# Patient Record
Sex: Female | Born: 1955 | Race: Black or African American | Hispanic: No | Marital: Married | Smoking: Never smoker
Health system: Southern US, Community
[De-identification: ages and names within clinical notes are randomized; demographics above are authoritative.]

## PROBLEM LIST (undated history)

## (undated) DIAGNOSIS — I10 Essential (primary) hypertension: Secondary | ICD-10-CM

## (undated) DIAGNOSIS — Z8673 Personal history of transient ischemic attack (TIA), and cerebral infarction without residual deficits: Secondary | ICD-10-CM

## (undated) DIAGNOSIS — Z5320 Procedure and treatment not carried out because of patient's decision for unspecified reasons: Secondary | ICD-10-CM

## (undated) DIAGNOSIS — E785 Hyperlipidemia, unspecified: Secondary | ICD-10-CM

## (undated) DIAGNOSIS — I340 Nonrheumatic mitral (valve) insufficiency: Secondary | ICD-10-CM

## (undated) DIAGNOSIS — M791 Myalgia, unspecified site: Secondary | ICD-10-CM

## (undated) DIAGNOSIS — I2510 Atherosclerotic heart disease of native coronary artery without angina pectoris: Secondary | ICD-10-CM

## (undated) HISTORY — DX: Atherosclerotic heart disease of native coronary artery without angina pectoris: I25.10

## (undated) HISTORY — DX: Essential (primary) hypertension: I10

## (undated) HISTORY — DX: Nonrheumatic mitral (valve) insufficiency: I34.0

## (undated) HISTORY — DX: Personal history of transient ischemic attack (TIA), and cerebral infarction without residual deficits: Z86.73

## (undated) HISTORY — DX: Myalgia, unspecified site: M79.10

## (undated) HISTORY — DX: Hyperlipidemia, unspecified: E78.5

## (undated) HISTORY — PX: OTHER SURGICAL HISTORY: SHX169

## (undated) HISTORY — DX: Procedure and treatment not carried out because of patient's decision for unspecified reasons: Z53.20

---

## 2021-01-01 ENCOUNTER — Emergency Department (HOSPITAL_COMMUNITY)

## 2021-01-01 ENCOUNTER — Other Ambulatory Visit: Payer: Self-pay

## 2021-01-01 ENCOUNTER — Emergency Department (HOSPITAL_COMMUNITY)
Admission: EM | Admit: 2021-01-01 | Discharge: 2021-01-01 | Disposition: A | Discharge status: Home / Self Care | Attending: Emergency Medicine | Admitting: Emergency Medicine

## 2021-01-01 DIAGNOSIS — H53459 Other localized visual field defect, unspecified eye: Secondary | ICD-10-CM | POA: Insufficient documentation

## 2021-01-01 DIAGNOSIS — I10 Essential (primary) hypertension: Secondary | ICD-10-CM | POA: Diagnosis not present

## 2021-01-01 DIAGNOSIS — M79622 Pain in left upper arm: Secondary | ICD-10-CM | POA: Diagnosis not present

## 2021-01-01 DIAGNOSIS — Z8673 Personal history of transient ischemic attack (TIA), and cerebral infarction without residual deficits: Secondary | ICD-10-CM | POA: Diagnosis not present

## 2021-01-01 DIAGNOSIS — R202 Paresthesia of skin: Secondary | ICD-10-CM | POA: Diagnosis present

## 2021-01-01 LAB — COMPREHENSIVE METABOLIC PANEL
ALT: 28 U/L (ref 0–44)
AST: 24 U/L (ref 15–41)
Albumin: 3.8 g/dL (ref 3.5–5.0)
Alkaline Phosphatase: 68 U/L (ref 38–126)
Anion gap: 6 (ref 5–15)
BUN: 16 mg/dL (ref 8–23)
CO2: 31 mmol/L (ref 22–32)
Calcium: 9.5 mg/dL (ref 8.9–10.3)
Chloride: 103 mmol/L (ref 98–111)
Creatinine, Ser: 0.77 mg/dL (ref 0.44–1.00)
GFR, Estimated: >60 mL/min (ref >60)
Glucose, Bld: 114 mg/dL — ABNORMAL HIGH (ref 70–99)
Potassium: 3.7 mmol/L (ref 3.5–5.1)
Sodium: 140 mmol/L (ref 135–145)
Total Bilirubin: 0.3 mg/dL (ref 0.3–1.2)
Total Protein: 7.5 g/dL (ref 6.5–8.1)

## 2021-01-01 LAB — I-STAT CHEM 8, ED
BUN: 19 mg/dL (ref 8–23)
Calcium, Ion: 1.24 mmol/L (ref 1.15–1.40)
Chloride: 103 mmol/L (ref 98–111)
Creatinine, Ser: 0.8 mg/dL (ref 0.44–1.00)
Glucose, Bld: 107 mg/dL — ABNORMAL HIGH (ref 70–99)
HCT: 40 % (ref 36.0–46.0)
Hemoglobin: 13.6 g/dL (ref 12.0–15.0)
Potassium: 3.7 mmol/L (ref 3.5–5.1)
Sodium: 142 mmol/L (ref 135–145)
TCO2: 29 mmol/L (ref 22–32)

## 2021-01-01 LAB — CBC
HCT: 39.4 % (ref 36.0–46.0)
Hemoglobin: 12.9 g/dL (ref 12.0–15.0)
MCH: 31.7 pg (ref 26.0–34.0)
MCHC: 32.7 g/dL (ref 30.0–36.0)
MCV: 96.8 fL (ref 80.0–100.0)
Platelets: 410 10*3/uL — ABNORMAL HIGH (ref 150–400)
RBC: 4.07 MIL/uL (ref 3.87–5.11)
RDW: 13 % (ref 11.5–15.5)
WBC: 4.5 10*3/uL (ref 4.0–10.5)
nRBC: 0 % (ref 0.0–0.2)

## 2021-01-01 LAB — DIFFERENTIAL
Abs Immature Granulocytes: 0.01 10*3/uL (ref 0.00–0.07)
Basophils Absolute: 0 10*3/uL (ref 0.0–0.1)
Basophils Relative: 1 %
Eosinophils Absolute: 0.1 10*3/uL (ref 0.0–0.5)
Eosinophils Relative: 2 %
Immature Granulocytes: 0 %
Lymphocytes Relative: 41 %
Lymphs Abs: 1.8 10*3/uL (ref 0.7–4.0)
Monocytes Absolute: 0.5 10*3/uL (ref 0.1–1.0)
Monocytes Relative: 12 %
Neutro Abs: 2 10*3/uL (ref 1.7–7.7)
Neutrophils Relative %: 44 %

## 2021-01-01 LAB — PROTIME-INR
INR: 1.1 (ref 0.8–1.2)
Prothrombin Time: 13.5 seconds (ref 11.4–15.2)

## 2021-01-01 LAB — APTT: aPTT: 26 seconds (ref 24–36)

## 2021-01-01 MED ORDER — SODIUM CHLORIDE 0.9% FLUSH
3.0000 mL | Freq: Once | INTRAVENOUS | Status: AC
  Administered 2021-01-01: 10 mL via INTRAVENOUS

## 2021-01-01 NOTE — ED Provider Notes (Signed)
[facility] Provider Note   CSN: [id number] Arrival date & time: 01/01/21  1600     History Chief Complaint  Patient presents with  . Numbness    Allison Galvan is a 65 y.o. female.  Patient is a 65 year old female with past medical history significant for TIA and MI in 2009 and hypertension.  She presents today with left upper arm tightness, tingling in her left hand, left facial tingling that started around 1500.  She reports that she took a baby aspirin and came to the hospital.  Her symptoms lasted for about 30 minutes.  Currently, she only is experiencing tightness in her left upper arm in a helical pattern and left cheek tingling.  She denies any chest pain, difficulty breathing.  She denies any bilateral upper or lower extremity weakness.  Denies any leg symptoms.  Denies any changes in vision, has a chronic left eye decreased temporal visual field from her prior TIA.  She did not have any difficulty with speech.  States that prior to today, she was in her normal state of health.  She took her medications this morning.    She [place] with physicians in [place], as she used to live there, and frequently goes back. Denies history of DM, thyroid disorder, HLD Not on a statin Only take norvasc 10mg  QD and baby ASA QOD.    No past medical history on file.  There are no problems to display for this patient.    OB History   No obstetric history on file.     No family history on file.     Home Medications Prior to Admission medications   Not on File    Allergies    Patient has no allergy information on record.  Review of Systems   Review of Systems  Constitutional: Negative for activity change, appetite change, chills and fever.  HENT: Negative for congestion.   Eyes: Positive for visual disturbance (chronic).  Respiratory: Negative for cough and shortness of breath.   Cardiovascular: Negative for chest pain.   Gastrointestinal: Negative for abdominal pain, diarrhea, nausea and vomiting.  Genitourinary: Negative for difficulty urinating and dysuria.  Musculoskeletal:       Tightness in left upper extremity  Neurological: Negative for syncope, facial asymmetry, speech difficulty and weakness.       Left sided facial tingling    Physical Exam Updated Vital Signs BP (!) 165/87   Pulse 75   Temp 98.4 F (36.9 C) (Oral)   Resp 19   SpO2 99%   Physical Exam Vitals reviewed.  Constitutional:      General: She is not in acute distress.    Appearance: Normal appearance. She is not ill-appearing or toxic-appearing.  HENT:     Head: Normocephalic and atraumatic.     Nose: Nose normal.     Mouth/Throat:     Mouth: Mucous membranes are moist.  Eyes:     General: Visual field deficit (OS, temporally, chronic) present.     Extraocular Movements: Extraocular movements intact.     Conjunctiva/sclera: Conjunctivae normal.     Pupils: Pupils are equal, round, and reactive to light.     Comments: Left temporal visual field decreased, baseline per patient  Cardiovascular:     Rate and Rhythm: Normal rate and regular rhythm.     Heart sounds: No murmur heard. No friction rub. No gallop.   Pulmonary:     Effort: Pulmonary effort is normal.  Breath sounds: Normal breath sounds. No wheezing, rhonchi or rales.  Musculoskeletal:     Cervical back: Normal range of motion.  Neurological:     General: No focal deficit present.     Mental Status: She is alert and oriented to person, place, and time.     GCS: GCS eye subscore is 4. GCS verbal subscore is 5. GCS motor subscore is 6.     Cranial Nerves: No facial asymmetry.     Sensory: Sensory deficit (decreased sensation V2 distribution on left) present.     Motor: Motor function is intact. No weakness, atrophy or abnormal muscle tone.     Coordination: Coordination is intact.     Deep Tendon Reflexes:     Reflex Scores:      Patellar reflexes are  2+ on the right side and 2+ on the left side.      Achilles reflexes are 2+ on the right side and 2+ on the left side.    ED Results / Procedures / Treatments   Labs (all labs ordered are listed, but only abnormal results are displayed) Labs Reviewed  CBC - Abnormal; Notable for the following components:      Result Value   Platelets 410 (*)    All other components within normal limits  COMPREHENSIVE METABOLIC PANEL - Abnormal; Notable for the following components:   Glucose, Bld 114 (*)    All other components within normal limits  I-STAT CHEM 8, ED - Abnormal; Notable for the following components:   Glucose, Bld 107 (*)    All other components within normal limits  PROTIME-INR  APTT  DIFFERENTIAL  CBG MONITORING, ED    EKG None  Radiology CT HEAD WO CONTRAST  Result Date: 01/01/2021 CLINICAL DATA:  Neuro deficit EXAM: CT HEAD WITHOUT CONTRAST TECHNIQUE: Contiguous axial images were obtained from the base of the skull through the vertex without intravenous contrast. COMPARISON:  None. FINDINGS: Brain: Areas of low-density noted in the right frontal lobe and right occipital lobe compatible with infarcts, age indeterminate. Mild chronic small vessel disease throughout the deep white matter. No hemorrhage or hydrocephalus. Vascular: No hyperdense vessel or unexpected calcification. Skull: No acute calvarial abnormality. Sinuses/Orbits: Visualized paranasal sinuses and mastoids clear. Orbital soft tissues unremarkable. Other: None IMPRESSION: Age-indeterminate infarcts within the right frontal and occipital lobes. Chronic small vessel disease throughout the deep white matter. Electronically Signed   By: Charlett Nose M.D.   On: 01/01/2021 17:54   MR BRAIN WO CONTRAST  Result Date: 01/01/2021 CLINICAL DATA:  Initial evaluation for acute TIA. EXAM: MRI HEAD WITHOUT CONTRAST TECHNIQUE: Multiplanar, multiecho pulse sequences of the brain and surrounding structures were obtained without  intravenous contrast. COMPARISON:  Prior head CT from earlier same day. FINDINGS: Brain: Cerebral volume within normal limits for age. Patchy T2/FLAIR hyperintensity within the periventricular and deep white matter both cerebral hemispheres most consistent with chronic small vessel ischemic disease. Overall, appearance is mild to moderate in nature. Few scattered areas of encephalomalacia and gliosis involving the cortical gray matter of the right frontal, right occipital, and left parietal lobes are seen, consistent with chronic ischemic infarcts. Associated mild chronic hemosiderin staining. No abnormal foci of restricted diffusion to suggest acute or subacute ischemia. Gray-white matter differentiation otherwise maintained. No other areas of remote cortical infarction. No other evidence for acute or chronic intracranial hemorrhage. No mass lesion, midline shift or mass effect. No hydrocephalus or extra-axial fluid collection. Pituitary gland suprasellar region within normal limits. Midline  structures intact. Vascular: Major intracranial vascular flow voids are maintained. Skull and upper cervical spine: Craniocervical junction within normal limits. Bone marrow signal intensity normal. No focal marrow replacing lesion. No scalp soft tissue abnormality. Sinuses/Orbits: Globes and orbital soft tissues within normal limits. Paranasal sinuses are clear. No significant mastoid effusion. Inner ear structures grossly normal. Other: None. IMPRESSION: 1. No acute intracranial abnormality. 2. 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 3. Underlying mild to moderate chronic microvascular ischemic disease. Electronically Signed   By: Rise Mu M.D.   On: 01/01/2021 21:50   MR Cervical Spine Wo Contrast  Result Date: 01/01/2021 CLINICAL DATA:  Initial evaluation for cervical radiculopathy, left upper extremity tightness. EXAM: MRI CERVICAL SPINE  WITHOUT CONTRAST TECHNIQUE: Multiplanar, multisequence MR imaging of the cervical spine was performed. No intravenous contrast was administered. COMPARISON:  None available. FINDINGS: Alignment: Straightening of the normal cervical lordosis. No listhesis. Vertebrae: Vertebral body height maintained without acute or chronic fracture. Bone marrow signal intensity within normal limits. No discrete or worrisome osseous lesions. No abnormal marrow edema. Cord: Normal signal morphology. Posterior Fossa, vertebral arteries, paraspinal tissues: Visualized brain and posterior fossa within normal limits. Craniocervical junction normal. Paraspinous and prevertebral soft tissues within normal limits. Normal intravascular flow voids seen within the vertebral arteries bilaterally. Disc levels: C2-C3: Unremarkable. C3-C4: Minimal annular disc bulge. No spinal stenosis. Foramina remain patent. C4-C5: Mild annular disc bulge with uncovertebral hypertrophy. Mild bilateral facet degeneration. No spinal stenosis. Foramina remain patent. C5-C6: Mild diffuse disc bulge with bilateral uncovertebral hypertrophy. Minimal flattening of the ventral thecal sac without significant spinal stenosis. Foramina remain patent. C6-C7: Mild disc bulge with uncovertebral hypertrophy. No significant spinal stenosis. Foramina remain patent. C7-T1: Minimal disc bulge. Mild facet hypertrophy. No spinal stenosis. Foramina remain widely patent. Visualized upper thoracic spine demonstrates no significant finding. IMPRESSION: 1. Mild noncompressive disc bulging at C3-4 through C7-T1 without significant stenosis or neural impingement. 2. Otherwise unremarkable MRI of the cervical spine. No findings to explain patient's symptoms identified. Electronically Signed   By: Rise Mu M.D.   On: 01/01/2021 21:55    Procedures Procedures   Medications Ordered in ED Medications  sodium chloride flush (NS) 0.9 % injection 3 mL (10 mLs Intravenous Given  01/01/21 2025)    ED Course  I have reviewed the triage vital signs and the nursing notes.  Pertinent labs & imaging results that were available during my care of the patient were reviewed by me and considered in my medical decision making (see chart for details).    MDM Rules/Calculators/A&P                          Patient roomed at 85.  Patient is a 65 year old female with known past medical history of TIA and MI in 2009, hypertension, who presents today with tingling in left V2 distribution and tightness in her left upper arm.  She also has left hand numbness, that has now resolved, lasted about 30 minutes.  Symptoms started around 1500.  She did take an aspirin.  CMP and CBC without abnormality.  Platelets slightly increased to 410. EKG with LBBB. CT head shows age-indeterminate infarcts within right frontal and occipital lobes.  She also has chronic small vessel disease throughout deep white matter.  She is neurologically intact on exam aside from decreased sensation in V2 distribution on left and known visual field defect on left.  Vital  signs are stable, she is mildly hypertensive.  Will obtain MRI brain without contrast and MRI of C-spine to further evaluate.  MRI C-spine showed mild noncompressive disc bulging at C3-4 through C7-T1 without significant stenosis or neural impingement, does not explain patient's symptoms.  MRI brain without findings of acute stroke, few scattered chronic ischemic infarct involving right frontal, right occipital, left parietal lobes.  Given this, patient's continued stable vital signs, and improvement in symptoms, stable for discharge.  Advised that she should establish care in [place] which she is planning to do.  She will call her cardiologist in [place] tomorrow.  Ultimately, she will need to be on statin therapy given her history, but will defer to PCP.  Patient reports resolution of symptoms and was discharged home in stable condition.   Final  Clinical Impression(s) / ED Diagnoses Final diagnoses:  Left face and left arm tingling    Rx / DC Orders ED Discharge Orders    None       Unknown Jim, DO 01/01/21 2220    Maia Plan, MD 01/02/21 0005

## 2021-01-01 NOTE — Discharge Instructions (Addendum)
Your work up was negative for acute stroke.  As we discussed, you do have signs of old strokes.  Given this and your history of heart attack, you may need to be on cholesterol medication in the future.  You should discuss this with your regular doctor.  Give your cardiologist to call tomorrow to see if they would like to make any changes.  I recommend that you find a primary care physician in [place].  If your symptoms return, worsen, you have new difficulty speaking or drooping of the face, you should come back to the emergency room immediately.

## 2021-01-01 NOTE — ED Notes (Signed)
DC instructions reviewed with pt. PT verbalized understanding. PT DC °

## 2021-01-01 NOTE — ED Triage Notes (Signed)
Pt said about 1 hr ago she started having left hand and left side of her face was numbness and tingling. Pt has great grips and strengths in all exstremties. No headache,

## 2021-01-01 NOTE — ED Notes (Signed)
Patient transported to MRI 

## 2021-01-01 NOTE — ED Provider Notes (Signed)
Patient placed in Quick Look pathway, seen and evaluated   Chief Complaint:facial tingling and left arm numbness  HPI:   Weakness left arm and tingling resolved  ROS: no facial weakness  Physical Exam:   Gen: No distress  Neuro: Awake and Alert  Skin: Warm    Focused Exam: numbness left arm   Initiation of care has begun. The patient has been counseled on the process, plan, and necessity for staying for the completion/evaluation, and the remainder of the medical screening examination    Osie Cheeks 01/01/21 1704    Sabas Sous, MD 01/02/21 2330

## 2022-07-27 DIAGNOSIS — Z8673 Personal history of transient ischemic attack (TIA), and cerebral infarction without residual deficits: Secondary | ICD-10-CM | POA: Diagnosis not present

## 2022-07-27 DIAGNOSIS — I6523 Occlusion and stenosis of bilateral carotid arteries: Secondary | ICD-10-CM | POA: Diagnosis not present

## 2022-07-27 DIAGNOSIS — I2510 Atherosclerotic heart disease of native coronary artery without angina pectoris: Secondary | ICD-10-CM | POA: Diagnosis not present

## 2022-08-06 DIAGNOSIS — R9431 Abnormal electrocardiogram [ECG] [EKG]: Secondary | ICD-10-CM | POA: Diagnosis not present

## 2022-08-06 DIAGNOSIS — I2510 Atherosclerotic heart disease of native coronary artery without angina pectoris: Secondary | ICD-10-CM | POA: Diagnosis not present

## 2022-08-06 DIAGNOSIS — I252 Old myocardial infarction: Secondary | ICD-10-CM | POA: Diagnosis not present

## 2022-08-06 DIAGNOSIS — I447 Left bundle-branch block, unspecified: Secondary | ICD-10-CM | POA: Diagnosis not present

## 2022-08-06 DIAGNOSIS — Z955 Presence of coronary angioplasty implant and graft: Secondary | ICD-10-CM | POA: Diagnosis not present

## 2022-08-06 DIAGNOSIS — I10 Essential (primary) hypertension: Secondary | ICD-10-CM | POA: Diagnosis not present

## 2022-08-06 DIAGNOSIS — E785 Hyperlipidemia, unspecified: Secondary | ICD-10-CM | POA: Diagnosis not present

## 2022-08-06 DIAGNOSIS — Z7982 Long term (current) use of aspirin: Secondary | ICD-10-CM | POA: Diagnosis not present

## 2022-08-06 DIAGNOSIS — I361 Nonrheumatic tricuspid (valve) insufficiency: Secondary | ICD-10-CM | POA: Diagnosis not present

## 2022-11-11 IMAGING — MR MR HEAD W/O CM
6 of 11 series · 24 of 48 positions shown · non-contrast
Comparison: Prior head CT from earlier same day.

CLINICAL DATA: Initial evaluation for acute TIA.

EXAM:
MRI HEAD WITHOUT CONTRAST
TECHNIQUE: Multiplanar, multiecho pulse sequences of the brain and surrounding
structures were obtained without intravenous contrast.

[Series 2: DWI · axial · 3.0mm · 0.94mm/px · z∈[-99,+42]mm · 7 of 106 slices shown (1 of 2)]
[im 1/106]
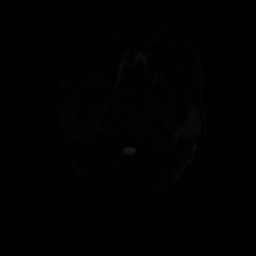
[im 18/106]
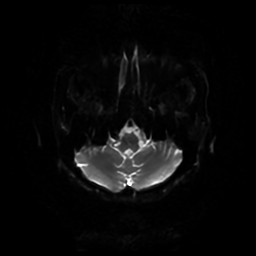
[im 36/106]
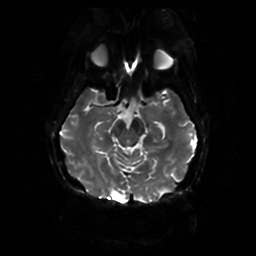
[im 53/106]
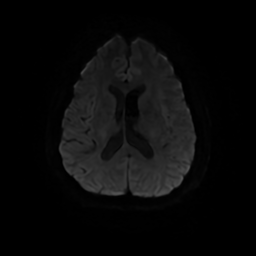
[im 71/106]
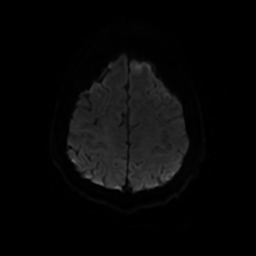
[im 88/106]
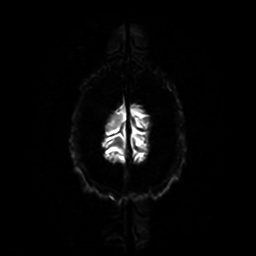
[im 106/106]
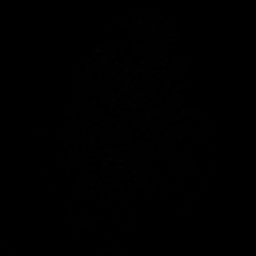

[Series 3: DWI · coronal · 4.0mm · 0.94mm/px · 6 of 74 slices shown (2 of 2)]
[im 1/74]
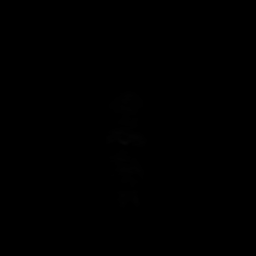
[im 15/74]
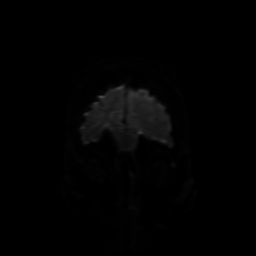
[im 30/74]
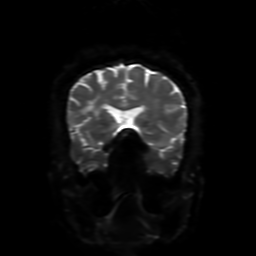
[im 44/74]
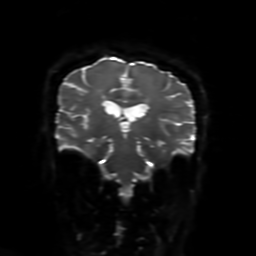
[im 59/74]
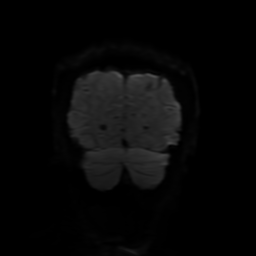
[im 74/74]
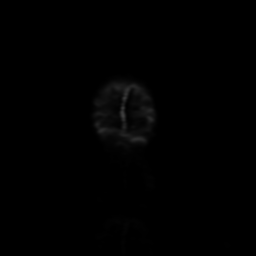

[Series 4: FLAIR · sagittal · 5.0mm · 0.23mm/px · 2 of 26 slices shown (1 of 2)]
[im 1/26]
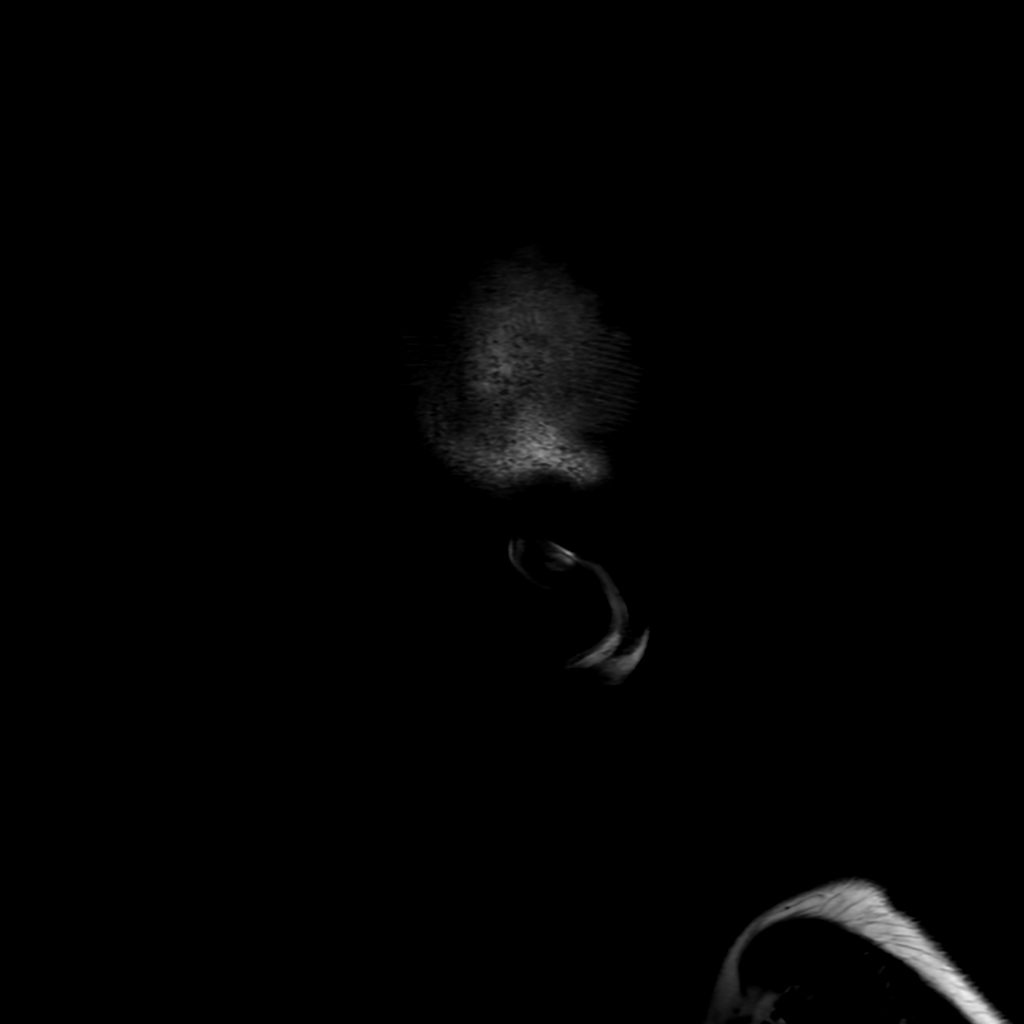
[im 26/26]
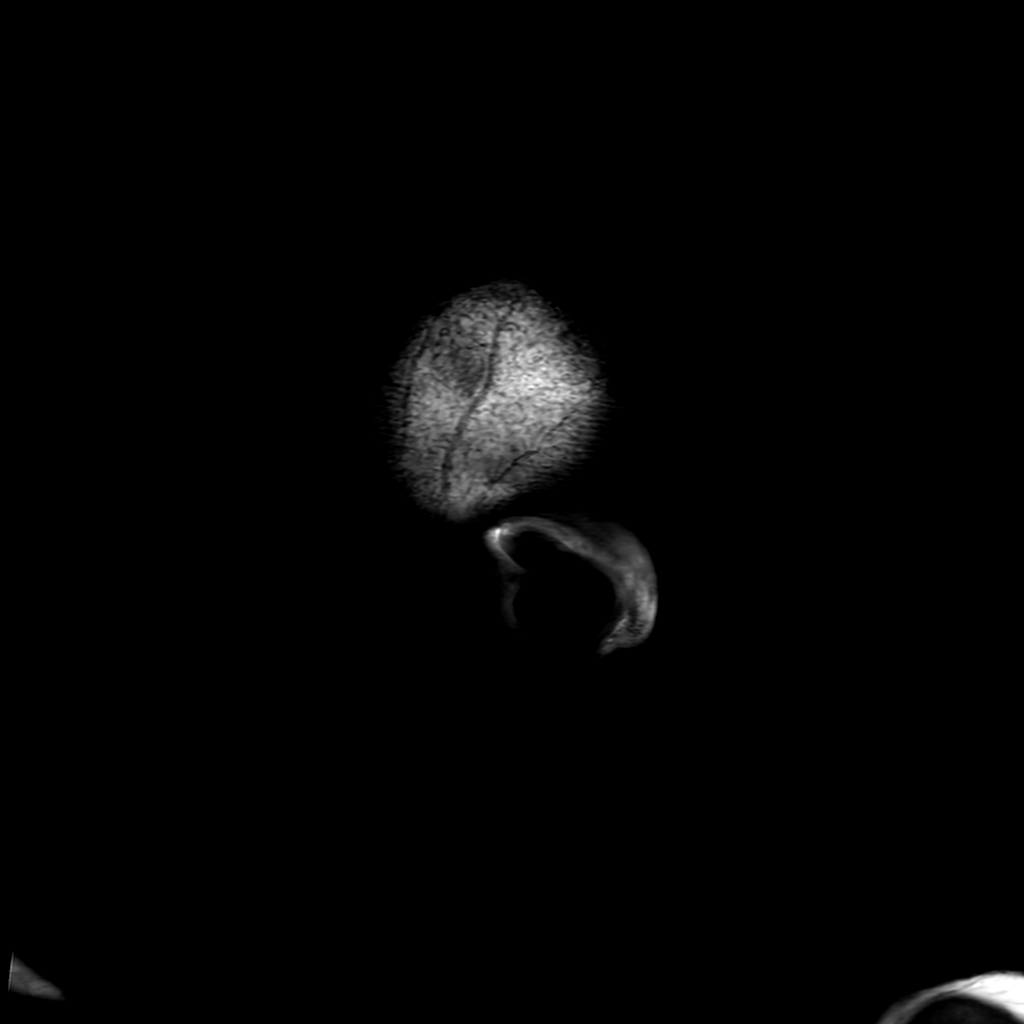

[Series 6: FLAIR · axial · 3.0mm · 0.45mm/px · z∈[-87,+54]mm · 2 of 26 slices shown (2 of 2)]
[im 1/26]
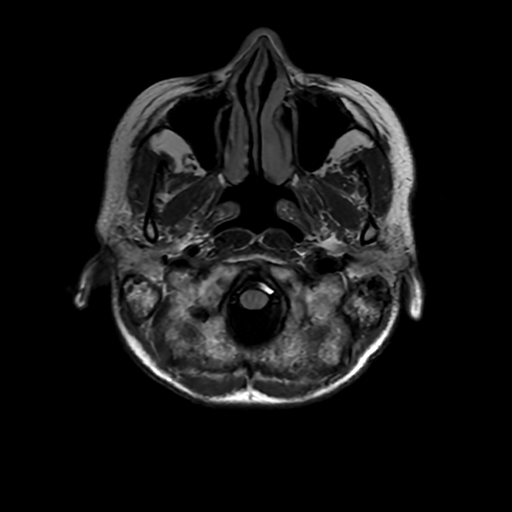
[im 26/26]
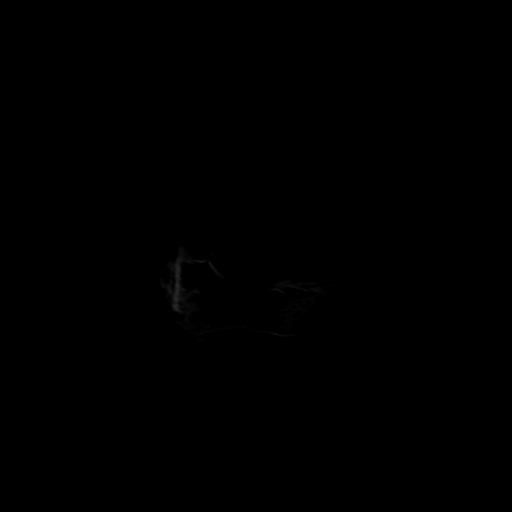

[Series 250: ADC · axial · 3.0mm · 0.94mm/px · z∈[-99,+36]mm · 4 of 51 slices shown (1 of 2)]
[im 1/51]
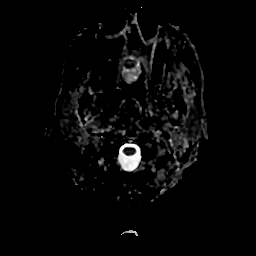
[im 17/51]
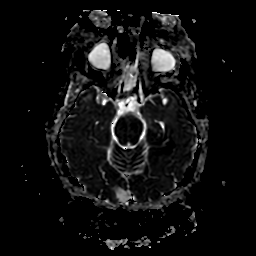
[im 34/51]
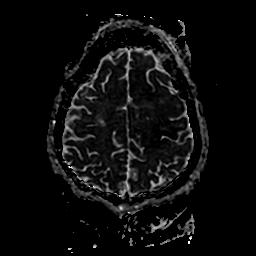
[im 51/51]
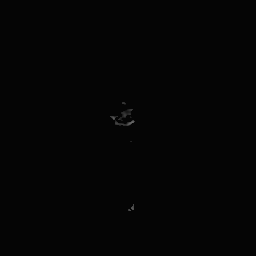

[Series 350: ADC · coronal · 4.0mm · 0.94mm/px · 3 of 36 slices shown (2 of 2)]
[im 1/36]
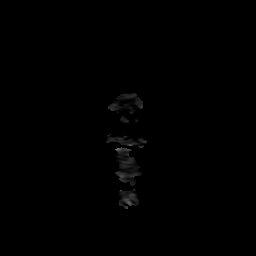
[im 18/36]
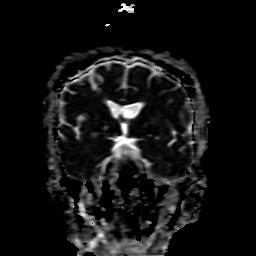
[im 36/36]
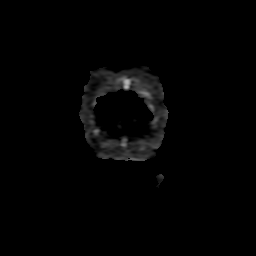

[24 of 48 positions shown; findings below may reference images not displayed]

FINDINGS: Brain: Cerebral volume within normal limits for age. Patchy T2/FLAIR
hyperintensity within the periventricular and deep white matter both
cerebral hemispheres most consistent with chronic small vessel
ischemic disease. Overall, appearance is mild to moderate in nature.
Few scattered areas of encephalomalacia and gliosis involving the
cortical gray matter of the right frontal, right occipital, and left
parietal lobes are seen, consistent with chronic ischemic infarcts.
Associated mild chronic hemosiderin staining.

No abnormal foci of restricted diffusion to suggest acute or
subacute ischemia. Gray-white matter differentiation otherwise
maintained. No other areas of remote cortical infarction. No other
evidence for acute or chronic intracranial hemorrhage.

No mass lesion, midline shift or mass effect. No hydrocephalus or
extra-axial fluid collection. Pituitary gland suprasellar region
within normal limits. Midline structures intact.

Vascular: Major intracranial vascular flow voids are maintained.

Skull and upper cervical spine: Craniocervical junction within
normal limits. Bone marrow signal intensity normal. No focal marrow
replacing lesion. No scalp soft tissue abnormality.

Sinuses/Orbits: Globes and orbital soft tissues within normal
limits. Paranasal sinuses are clear. No significant mastoid
effusion. Inner ear structures grossly normal.

Other: None.
IMPRESSION: 1. No acute intracranial abnormality.
2. Few scattered chronic ischemic infarcts involving the right
frontal, right occipital, and left parietal lobes.
3. Underlying mild to moderate chronic microvascular ischemic
disease.

## 2022-11-11 IMAGING — MR MR CERVICAL SPINE W/O CM
4 of 6 series · 19 of 48 positions shown · non-contrast
Comparison: None available.

CLINICAL DATA: Initial evaluation for cervical radiculopathy, left
upper extremity tightness.

EXAM:
MRI CERVICAL SPINE WITHOUT CONTRAST
TECHNIQUE: Multiplanar, multisequence MR imaging of the cervical spine was
performed. No intravenous contrast was administered.

[Series 2: T2 · sagittal · 3.0mm · 0.35mm/px · 3 of 16 slices shown (1 of 2)]
[im 1/16]
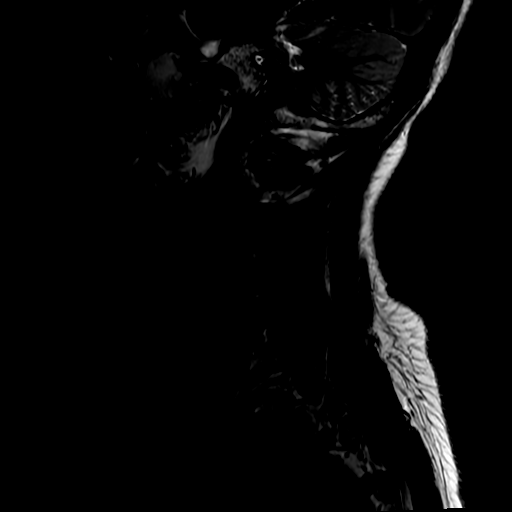
[im 8/16]
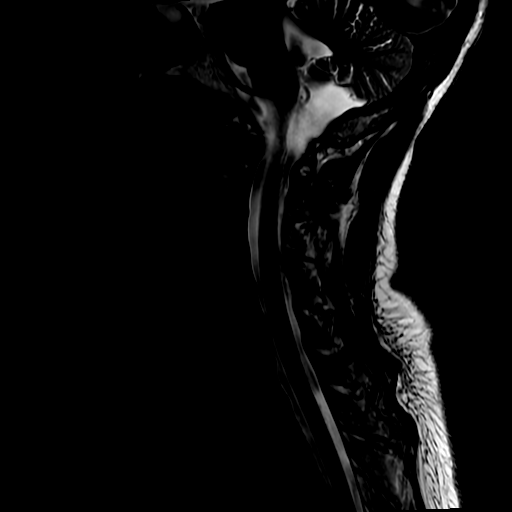
[im 16/16]
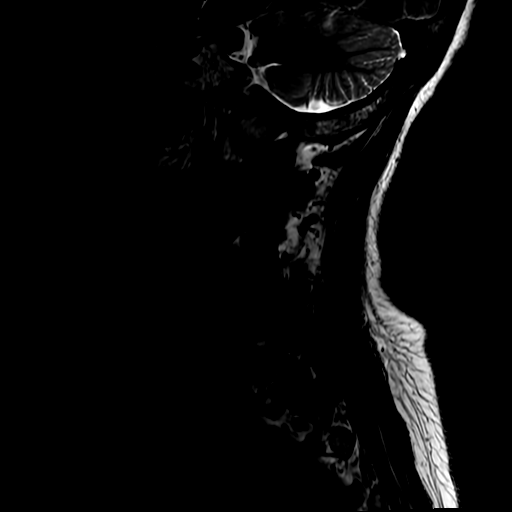

[Series 4: STIR · sagittal · 3.0mm · 0.35mm/px · 3 of 16 slices shown]
[im 1/16]
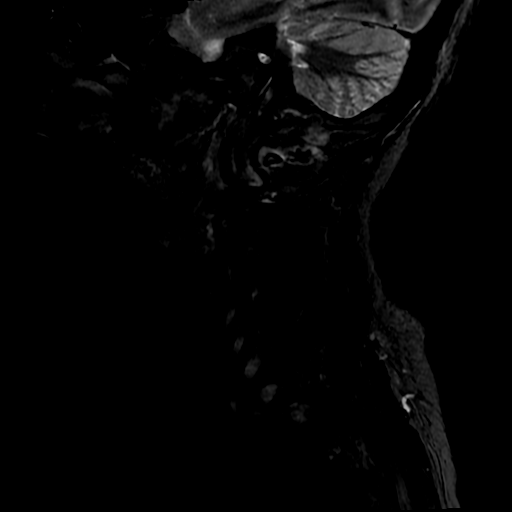
[im 11/16]
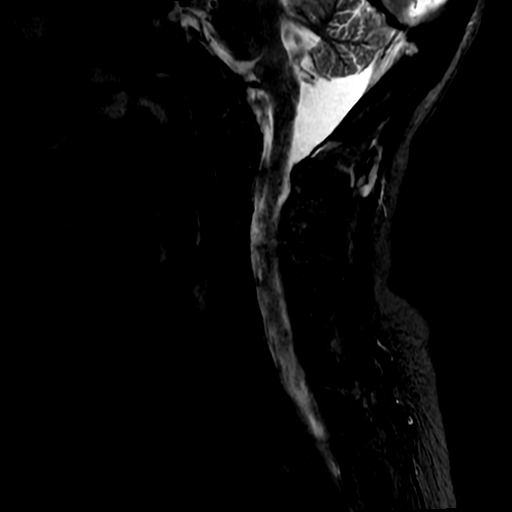
[im 16/16]
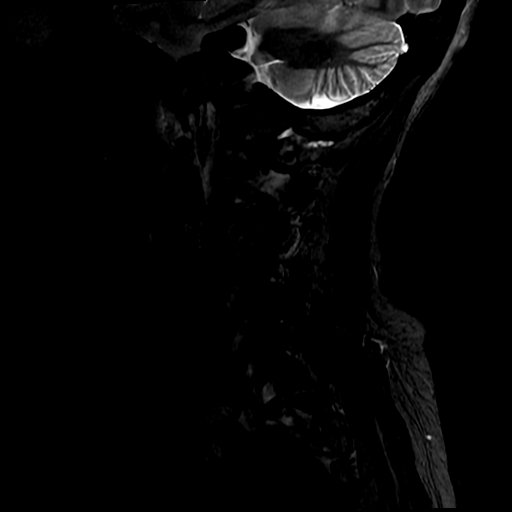

[Series 6: T2 · axial · 3.0mm · 0.35mm/px · z∈[-115,-22]mm · 8 of 33 slices shown (2 of 2)]
[im 1/33]
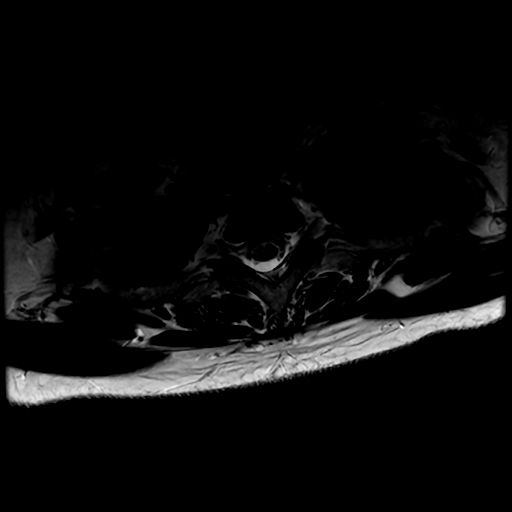
[im 5/33]
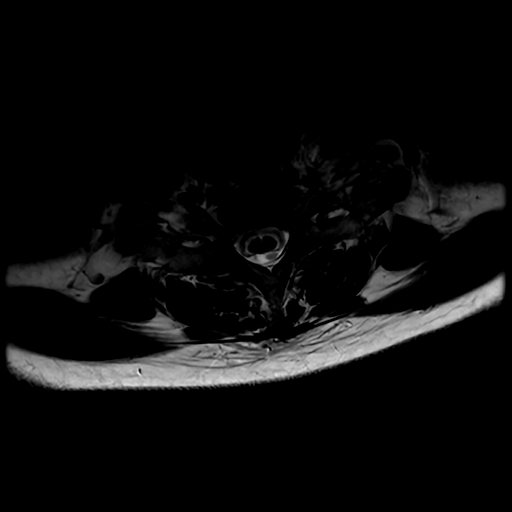
[im 10/33]
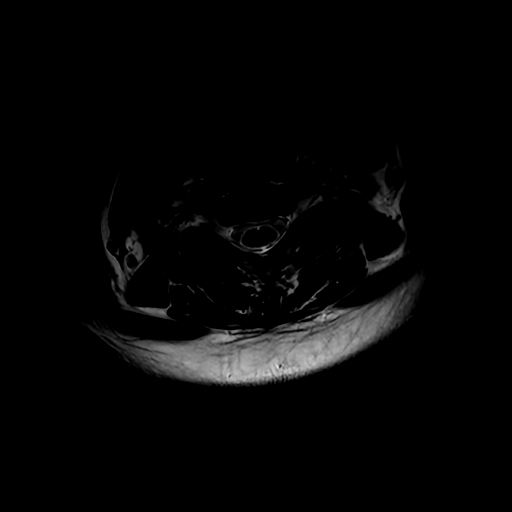
[im 14/33]
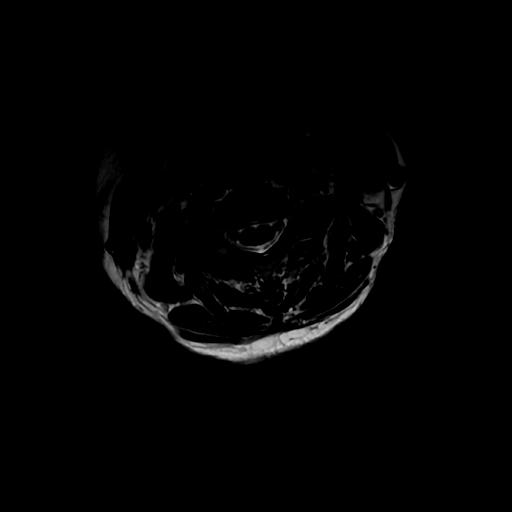
[im 19/33]
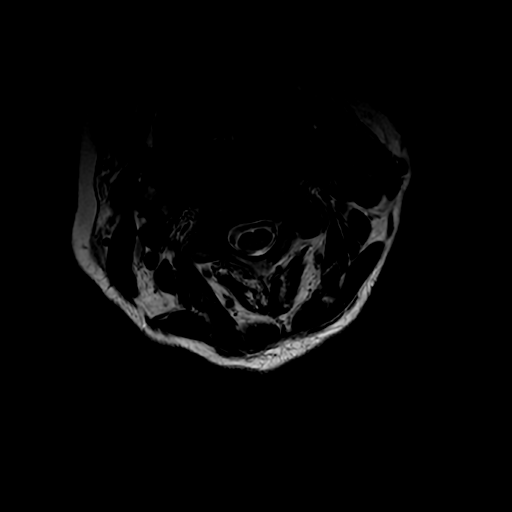
[im 23/33]
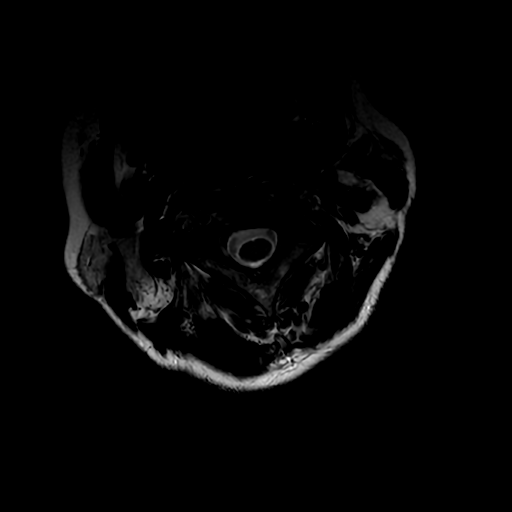
[im 28/33]
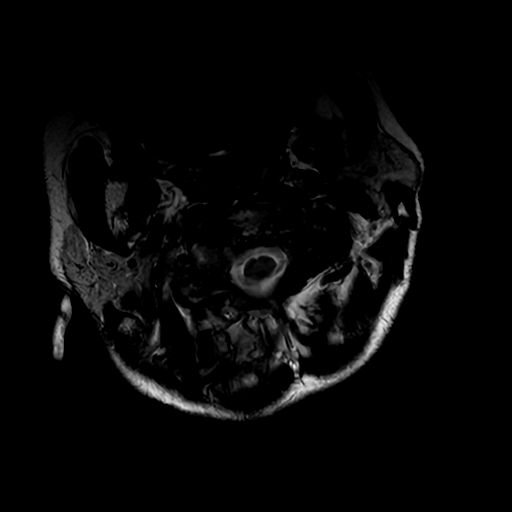
[im 33/33]
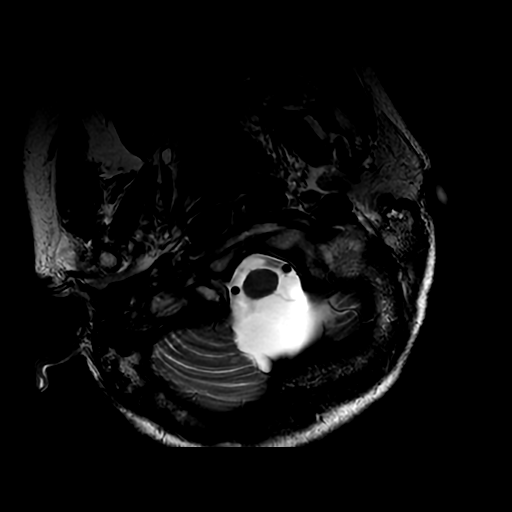

[Series 7: T1 · axial · non-contrast · 3.0mm · 0.35mm/px · z∈[-115,-39]mm · 5 of 32 slices shown]
[im 1/32]
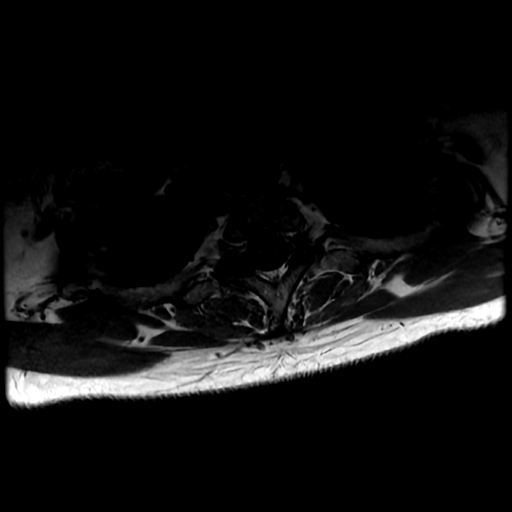
[im 6/32]
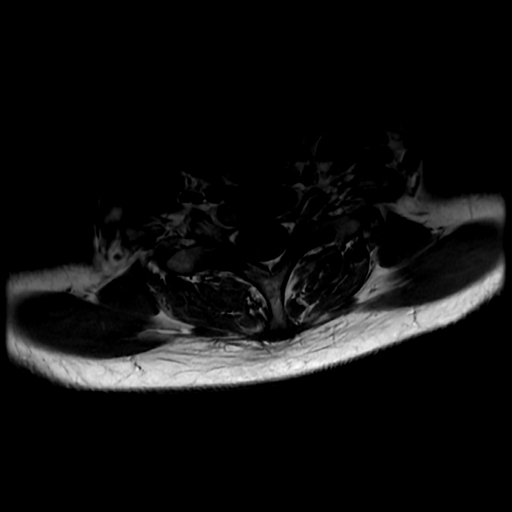
[im 11/32]
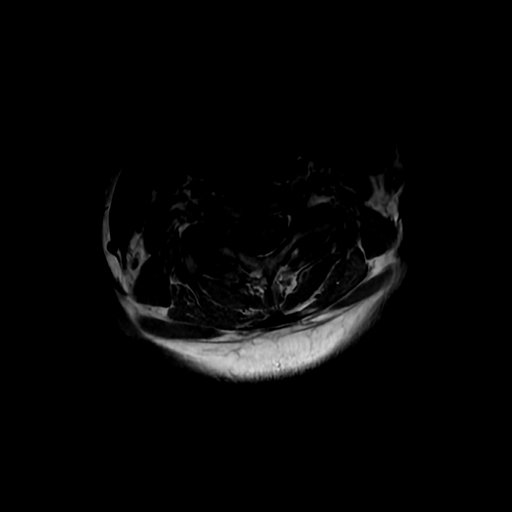
[im 16/32]
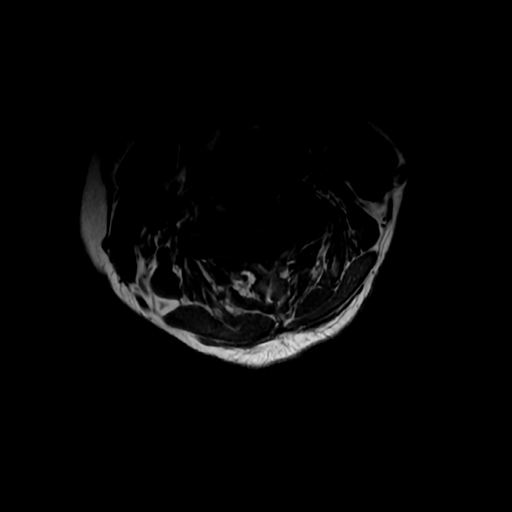
[im 26/32]
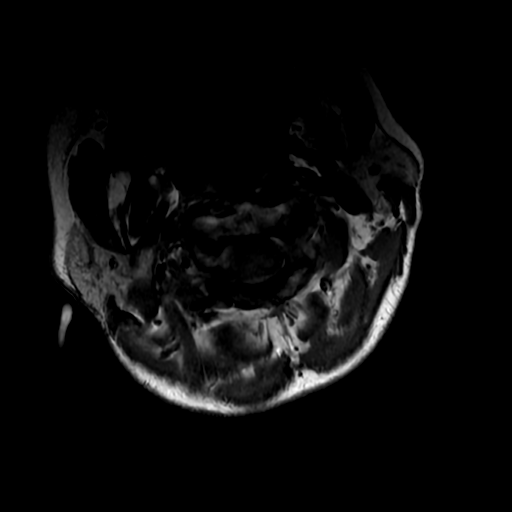

[19 of 48 positions shown; findings below may reference images not displayed]

FINDINGS: Alignment: Straightening of the normal cervical lordosis. No
listhesis.

Vertebrae: Vertebral body height maintained without acute or chronic
fracture. Bone marrow signal intensity within normal limits. No
discrete or worrisome osseous lesions. No abnormal marrow edema.

Cord: Normal signal morphology.

Posterior Fossa, vertebral arteries, paraspinal tissues: Visualized
brain and posterior fossa within normal limits. Craniocervical
junction normal. Paraspinous and prevertebral soft tissues within
normal limits. Normal intravascular flow voids seen within the
vertebral arteries bilaterally.

Disc levels:

C2-C3: Unremarkable.

C3-C4: Minimal annular disc bulge. No spinal stenosis. Foramina
remain patent.

C4-C5: Mild annular disc bulge with uncovertebral hypertrophy. Mild
bilateral facet degeneration. No spinal stenosis. Foramina remain
patent.

C5-C6: Mild diffuse disc bulge with bilateral uncovertebral
hypertrophy. Minimal flattening of the ventral thecal sac without
significant spinal stenosis. Foramina remain patent.

C6-C7: Mild disc bulge with uncovertebral hypertrophy. No
significant spinal stenosis. Foramina remain patent.

C7-T1: Minimal disc bulge. Mild facet hypertrophy. No spinal
stenosis. Foramina remain widely patent.

Visualized upper thoracic spine demonstrates no significant finding.
IMPRESSION: 1. Mild noncompressive disc bulging at C3-4 through C7-T1 without
significant stenosis or neural impingement.
2. Otherwise unremarkable MRI of the cervical spine. No findings to
explain patient's symptoms identified.

## 2022-12-22 DIAGNOSIS — Z1231 Encounter for screening mammogram for malignant neoplasm of breast: Secondary | ICD-10-CM | POA: Diagnosis not present

## 2022-12-22 DIAGNOSIS — I10 Essential (primary) hypertension: Secondary | ICD-10-CM | POA: Diagnosis not present

## 2022-12-22 DIAGNOSIS — E785 Hyperlipidemia, unspecified: Secondary | ICD-10-CM | POA: Diagnosis not present

## 2022-12-22 DIAGNOSIS — Z8673 Personal history of transient ischemic attack (TIA), and cerebral infarction without residual deficits: Secondary | ICD-10-CM | POA: Diagnosis not present

## 2022-12-22 DIAGNOSIS — I2510 Atherosclerotic heart disease of native coronary artery without angina pectoris: Secondary | ICD-10-CM | POA: Diagnosis not present

## 2022-12-22 DIAGNOSIS — H539 Unspecified visual disturbance: Secondary | ICD-10-CM | POA: Diagnosis not present

## 2022-12-22 DIAGNOSIS — M791 Myalgia, unspecified site: Secondary | ICD-10-CM | POA: Diagnosis not present

## 2023-01-19 ENCOUNTER — Other Ambulatory Visit: Payer: Self-pay | Admitting: Internal Medicine

## 2023-01-19 DIAGNOSIS — Z1231 Encounter for screening mammogram for malignant neoplasm of breast: Secondary | ICD-10-CM | POA: Diagnosis not present

## 2023-01-19 DIAGNOSIS — I2510 Atherosclerotic heart disease of native coronary artery without angina pectoris: Secondary | ICD-10-CM | POA: Diagnosis not present

## 2023-01-19 DIAGNOSIS — E785 Hyperlipidemia, unspecified: Secondary | ICD-10-CM | POA: Diagnosis not present

## 2023-01-19 DIAGNOSIS — I10 Essential (primary) hypertension: Secondary | ICD-10-CM | POA: Diagnosis not present

## 2023-01-28 ENCOUNTER — Ambulatory Visit
Admission: RE | Admit: 2023-01-28 | Discharge: 2023-01-28 | Disposition: A | Discharge status: Home / Self Care | Payer: Commercial Managed Care - HMO | Source: Ambulatory Visit | Attending: Internal Medicine | Admitting: Internal Medicine

## 2023-01-28 DIAGNOSIS — Z1231 Encounter for screening mammogram for malignant neoplasm of breast: Secondary | ICD-10-CM

## 2023-02-03 ENCOUNTER — Other Ambulatory Visit: Payer: Self-pay | Admitting: Internal Medicine

## 2023-02-03 DIAGNOSIS — R928 Other abnormal and inconclusive findings on diagnostic imaging of breast: Secondary | ICD-10-CM

## 2023-02-16 ENCOUNTER — Ambulatory Visit
Admission: RE | Admit: 2023-02-16 | Discharge: 2023-02-16 | Disposition: A | Discharge status: Home / Self Care | Payer: Commercial Managed Care - HMO | Source: Ambulatory Visit | Attending: Internal Medicine | Admitting: Internal Medicine

## 2023-02-16 ENCOUNTER — Ambulatory Visit
Admission: RE | Admit: 2023-02-16 | Discharge: 2023-02-16 | Disposition: A | Discharge status: Home / Self Care | Source: Ambulatory Visit | Attending: Internal Medicine | Admitting: Internal Medicine

## 2023-02-16 DIAGNOSIS — N6323 Unspecified lump in the left breast, lower outer quadrant: Secondary | ICD-10-CM | POA: Diagnosis not present

## 2023-02-16 DIAGNOSIS — N6342 Unspecified lump in left breast, subareolar: Secondary | ICD-10-CM | POA: Diagnosis not present

## 2023-02-16 DIAGNOSIS — R922 Inconclusive mammogram: Secondary | ICD-10-CM | POA: Diagnosis not present

## 2023-02-16 DIAGNOSIS — R928 Other abnormal and inconclusive findings on diagnostic imaging of breast: Secondary | ICD-10-CM

## 2023-02-16 DIAGNOSIS — N6321 Unspecified lump in the left breast, upper outer quadrant: Secondary | ICD-10-CM | POA: Diagnosis not present

## 2023-02-16 DIAGNOSIS — N63 Unspecified lump in unspecified breast: Secondary | ICD-10-CM | POA: Diagnosis not present

## 2023-03-04 DIAGNOSIS — H40013 Open angle with borderline findings, low risk, bilateral: Secondary | ICD-10-CM | POA: Diagnosis not present

## 2023-03-04 DIAGNOSIS — H53452 Other localized visual field defect, left eye: Secondary | ICD-10-CM | POA: Diagnosis not present

## 2023-03-04 DIAGNOSIS — Z0100 Encounter for examination of eyes and vision without abnormal findings: Secondary | ICD-10-CM | POA: Diagnosis not present

## 2023-03-04 DIAGNOSIS — H43811 Vitreous degeneration, right eye: Secondary | ICD-10-CM | POA: Diagnosis not present

## 2023-03-04 DIAGNOSIS — H2513 Age-related nuclear cataract, bilateral: Secondary | ICD-10-CM | POA: Diagnosis not present

## 2023-04-08 DIAGNOSIS — I2510 Atherosclerotic heart disease of native coronary artery without angina pectoris: Secondary | ICD-10-CM | POA: Diagnosis not present

## 2023-04-08 DIAGNOSIS — Z955 Presence of coronary angioplasty implant and graft: Secondary | ICD-10-CM | POA: Diagnosis not present

## 2023-04-08 DIAGNOSIS — Z8673 Personal history of transient ischemic attack (TIA), and cerebral infarction without residual deficits: Secondary | ICD-10-CM | POA: Diagnosis not present

## 2023-04-08 DIAGNOSIS — R9431 Abnormal electrocardiogram [ECG] [EKG]: Secondary | ICD-10-CM | POA: Diagnosis not present

## 2023-04-08 DIAGNOSIS — Z823 Family history of stroke: Secondary | ICD-10-CM | POA: Diagnosis not present

## 2023-04-08 DIAGNOSIS — I252 Old myocardial infarction: Secondary | ICD-10-CM | POA: Diagnosis not present

## 2023-04-08 DIAGNOSIS — E785 Hyperlipidemia, unspecified: Secondary | ICD-10-CM | POA: Diagnosis not present

## 2023-04-08 DIAGNOSIS — Z8679 Personal history of other diseases of the circulatory system: Secondary | ICD-10-CM | POA: Diagnosis not present

## 2023-04-08 DIAGNOSIS — I517 Cardiomegaly: Secondary | ICD-10-CM | POA: Diagnosis not present

## 2023-07-06 DIAGNOSIS — H40053 Ocular hypertension, bilateral: Secondary | ICD-10-CM | POA: Diagnosis not present

## 2023-07-06 DIAGNOSIS — H40013 Open angle with borderline findings, low risk, bilateral: Secondary | ICD-10-CM | POA: Diagnosis not present

## 2023-08-06 DIAGNOSIS — Z1211 Encounter for screening for malignant neoplasm of colon: Secondary | ICD-10-CM | POA: Diagnosis not present

## 2023-08-06 DIAGNOSIS — Z0000 Encounter for general adult medical examination without abnormal findings: Secondary | ICD-10-CM | POA: Diagnosis not present

## 2023-08-06 DIAGNOSIS — I2510 Atherosclerotic heart disease of native coronary artery without angina pectoris: Secondary | ICD-10-CM | POA: Diagnosis not present

## 2023-08-06 DIAGNOSIS — Z5320 Procedure and treatment not carried out because of patient's decision for unspecified reasons: Secondary | ICD-10-CM | POA: Diagnosis not present

## 2023-08-06 DIAGNOSIS — M791 Myalgia, unspecified site: Secondary | ICD-10-CM | POA: Diagnosis not present

## 2023-08-06 DIAGNOSIS — Z8673 Personal history of transient ischemic attack (TIA), and cerebral infarction without residual deficits: Secondary | ICD-10-CM | POA: Diagnosis not present

## 2023-08-06 DIAGNOSIS — E785 Hyperlipidemia, unspecified: Secondary | ICD-10-CM | POA: Diagnosis not present

## 2023-08-06 DIAGNOSIS — I10 Essential (primary) hypertension: Secondary | ICD-10-CM | POA: Diagnosis not present

## 2023-08-06 DIAGNOSIS — Z1231 Encounter for screening mammogram for malignant neoplasm of breast: Secondary | ICD-10-CM | POA: Diagnosis not present

## 2023-08-10 DIAGNOSIS — H40053 Ocular hypertension, bilateral: Secondary | ICD-10-CM | POA: Diagnosis not present

## 2023-09-28 DIAGNOSIS — H40053 Ocular hypertension, bilateral: Secondary | ICD-10-CM | POA: Diagnosis not present

## 2023-10-18 ENCOUNTER — Encounter: Payer: Self-pay | Admitting: Cardiovascular Disease

## 2023-10-18 NOTE — Progress Notes (Unsigned)
Cardiology Office Note:  .   Date:  10/19/2023  ID:  Allison Galvan, DOB [birth year], MRN [id number] PCP: Lorenda Ishihara, MD  [facility] Providers Cardiologist:  None    History of Present Illness: .   Allison Galvan is a 68 y.o. female with hx of CAD ,  HTN, MR  She moved from [place]   Hx of coronary stenting ~ 2010   Recent report from her cardiologist and Jola Babinski reveals the following history.  She had a non-ST segment elevation myocardial infarction in May, 2010.  She was treated with a bare-metal stent to the circumflex artery.   Her non-ST segment elevation myocardial infarction was complicated by a mini stroke.  She has some residual vision issues since that time.  Several months later she had a drug-eluting stent to the right coronary artery.  She was not on statin therapy until she arrived in [place] and got on Medicare.  Her LDL has not been optimally controlled.     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  She has been on HCTZ in the past and it caused her to become volume depleted.  She does not want to restart HCTZ at this time.  She will work on improved diet exercise and weight loss.  ROS:   Studies Reviewed: Marland Kitchen   EKG Interpretation Date/Time:  Tuesday October 19 2023 14:53:55 EST Ventricular Rate:  79 PR Interval:  164 QRS Duration:  90 QT Interval:  364 QTC Calculation: 417 R Axis:   71  Text Interpretation: Normal sinus rhythm Minimal voltage criteria for LVH, may be normal variant ( Sokolow-Lyon ) Nonspecific ST and T wave abnormality When compared with ECG of 01-Jan-2021 16:18, Left bundle branch block is no longer Present Confirmed by Kristeen Miss ([id number]) on 10/19/2023 3:11:05 PM     Risk Assessment/Calculations:     HYPERTENSION  CONTROL Vitals:   10/19/23 1450 10/19/23 1511  BP: (!) 152/92 (!) 145/95    The patient's blood pressure is elevated above target today.  In order to address the patient's elevated BP: Blood pressure will be monitored at home to determine if medication changes need to be made.          Physical Exam:   VS:  BP (!) 145/95   Pulse 89   Ht 5\' 4"  (1.626 m)   Wt 141 lb (64 kg)   SpO2 99%   BMI 24.20 kg/m    Wt Readings from Last 3 Encounters:  10/19/23 141 lb (64 kg)    GEN: Well nourished, well developed in no acute distress NECK: No JVD; No carotid bruits CARDIAC: RRR, no murmurs, rubs, gallops RESPIRATORY:  Clear to auscultation without rales, wheezing or rhonchi  ABDOMEN: Soft, non-tender, non-distended EXTREMITIES:  No edema; No deformity   ASSESSMENT AND PLAN: .   1.  Coronary artery disease.  She is doing well.  She is not having any episodes of angina.  2.  Hyperlipidemia: She was started on atorvastatin recently.  Her last LDL was 81.  We discussed the fact that her LDL goal is less than 70.  She wants to follow-up with her primary medical doctor.  3.  Hypertension: She is a little anxious in the office today.  Her blood  pressure is mildly elevated.  We discussed adding hydrochlorothiazide  we will have her follow-up with [place] in 1 year.   but she has tried HCTZ in the past and does not want to retake at this time.  She will work on better diet, exercise, weight loss program.  4.  History of left bundle branch block: She has a left bundle branch block on previous EKGs.  Her current EKG does not show a left bundle branch block but does show LVH changes.       Dispo: 1 year with Dr. Mayford Knife    Signed, Kristeen Miss, MD

## 2023-10-19 ENCOUNTER — Ambulatory Visit: Payer: Medicare HMO | Attending: Cardiovascular Disease | Admitting: Cardiovascular Disease

## 2023-10-19 ENCOUNTER — Encounter: Payer: Self-pay | Admitting: Cardiovascular Disease

## 2023-10-19 VITALS — BP 145/95 | HR 89 | Ht 64.0 in | Wt 141.0 lb

## 2023-10-19 DIAGNOSIS — I38 Endocarditis, valve unspecified: Secondary | ICD-10-CM | POA: Diagnosis not present

## 2023-10-19 NOTE — Patient Instructions (Signed)
Follow-Up: At [facility], you and your health needs are our priority.  As part of our continuing mission to provide you with exceptional heart care, we have created designated Provider Care Teams.  These Care Teams include your primary Cardiologist (physician) and Advanced Practice Providers (APPs -  Physician Assistants and Nurse Practitioners) who all work together to provide you with the care you need, when you need it.  Your next appointment:   1 year(s)  Provider:   Armanda Magic, MD   1st Floor: - Lobby - Registration  - Pharmacy  - Lab - Cafe  2nd Floor: - PV Lab - Diagnostic Testing (echo, CT, nuclear med)  3rd Floor: - Vacant  4th Floor: - TCTS (cardiothoracic surgery) - AFib Clinic - Structural Heart Clinic - Vascular Surgery  - Vascular Ultrasound  5th Floor: - HeartCare Cardiology (general and EP) - Clinical Pharmacy for coumadin, hypertension, lipid, weight-loss medications, and med management appointments    Valet parking services will be available as well.

## 2023-11-08 DIAGNOSIS — I10 Essential (primary) hypertension: Secondary | ICD-10-CM | POA: Diagnosis not present

## 2023-11-08 DIAGNOSIS — E785 Hyperlipidemia, unspecified: Secondary | ICD-10-CM | POA: Diagnosis not present

## 2023-11-08 DIAGNOSIS — Z1211 Encounter for screening for malignant neoplasm of colon: Secondary | ICD-10-CM | POA: Diagnosis not present

## 2023-11-08 DIAGNOSIS — Z8673 Personal history of transient ischemic attack (TIA), and cerebral infarction without residual deficits: Secondary | ICD-10-CM | POA: Diagnosis not present

## 2023-11-08 DIAGNOSIS — I2510 Atherosclerotic heart disease of native coronary artery without angina pectoris: Secondary | ICD-10-CM | POA: Diagnosis not present

## 2023-11-09 DIAGNOSIS — Z1211 Encounter for screening for malignant neoplasm of colon: Secondary | ICD-10-CM | POA: Diagnosis not present

## 2023-11-16 DIAGNOSIS — H40053 Ocular hypertension, bilateral: Secondary | ICD-10-CM | POA: Diagnosis not present

## 2024-01-04 DIAGNOSIS — H40053 Ocular hypertension, bilateral: Secondary | ICD-10-CM | POA: Diagnosis not present

## 2024-03-09 DIAGNOSIS — H40053 Ocular hypertension, bilateral: Secondary | ICD-10-CM | POA: Diagnosis not present

## 2024-03-09 DIAGNOSIS — H2513 Age-related nuclear cataract, bilateral: Secondary | ICD-10-CM | POA: Diagnosis not present

## 2024-03-09 DIAGNOSIS — H43813 Vitreous degeneration, bilateral: Secondary | ICD-10-CM | POA: Diagnosis not present

## 2024-03-13 DIAGNOSIS — E785 Hyperlipidemia, unspecified: Secondary | ICD-10-CM | POA: Diagnosis not present

## 2024-03-15 DIAGNOSIS — Z1211 Encounter for screening for malignant neoplasm of colon: Secondary | ICD-10-CM | POA: Diagnosis not present

## 2024-03-15 DIAGNOSIS — I10 Essential (primary) hypertension: Secondary | ICD-10-CM | POA: Diagnosis not present

## 2024-03-15 DIAGNOSIS — E785 Hyperlipidemia, unspecified: Secondary | ICD-10-CM | POA: Diagnosis not present

## 2024-03-15 DIAGNOSIS — Z8673 Personal history of transient ischemic attack (TIA), and cerebral infarction without residual deficits: Secondary | ICD-10-CM | POA: Diagnosis not present

## 2024-03-15 DIAGNOSIS — I2510 Atherosclerotic heart disease of native coronary artery without angina pectoris: Secondary | ICD-10-CM | POA: Diagnosis not present

## 2024-03-15 DIAGNOSIS — I459 Conduction disorder, unspecified: Secondary | ICD-10-CM | POA: Diagnosis not present

## 2024-08-09 ENCOUNTER — Other Ambulatory Visit: Payer: Self-pay | Admitting: Internal Medicine

## 2024-08-09 DIAGNOSIS — Z1231 Encounter for screening mammogram for malignant neoplasm of breast: Secondary | ICD-10-CM

## 2024-09-06 ENCOUNTER — Ambulatory Visit
Admission: RE | Admit: 2024-09-06 | Discharge: 2024-09-06 | Disposition: A | Discharge status: Home / Self Care | Source: Ambulatory Visit | Attending: Internal Medicine | Admitting: Internal Medicine

## 2024-09-06 DIAGNOSIS — Z1231 Encounter for screening mammogram for malignant neoplasm of breast: Secondary | ICD-10-CM

## 2024-09-08 ENCOUNTER — Encounter: Payer: Self-pay | Admitting: Cardiology
